# Patient Record
Sex: Male | Born: 1965 | Race: White | Hispanic: Yes | Marital: Married
Health system: Western US, Academic
[De-identification: ages and names within clinical notes are randomized; demographics above are authoritative.]

---

## 2020-09-24 HISTORY — PX: OTHER PROCEDURE: U1053

## 2020-10-05 ENCOUNTER — Ambulatory Visit: Admit: 2020-10-05 | Payer: BC Managed Care – PPO | Source: Other Acute Inpatient Hospital

## 2020-10-05 ENCOUNTER — Encounter (HOSPITAL_COMMUNITY): Payer: Self-pay

## 2020-10-07 ENCOUNTER — Telehealth (HOSPITAL_COMMUNITY): Payer: Self-pay

## 2020-10-07 ENCOUNTER — Encounter (INDEPENDENT_AMBULATORY_CARE_PROVIDER_SITE_OTHER): Payer: Self-pay | Admitting: Pulmonary Medicine

## 2020-10-07 ENCOUNTER — Telehealth (INDEPENDENT_AMBULATORY_CARE_PROVIDER_SITE_OTHER): Payer: Self-pay | Admitting: Pulmonary Medicine

## 2020-10-07 ENCOUNTER — Encounter (HOSPITAL_COMMUNITY): Payer: Self-pay

## 2020-10-07 DIAGNOSIS — K559 Vascular disorder of intestine, unspecified: Secondary | ICD-10-CM

## 2020-10-07 DIAGNOSIS — R5381 Other malaise: Secondary | ICD-10-CM

## 2020-10-07 DIAGNOSIS — J80 Acute respiratory distress syndrome: Secondary | ICD-10-CM

## 2020-10-07 DIAGNOSIS — U071 COVID-19: Secondary | ICD-10-CM

## 2020-10-07 DIAGNOSIS — Z7682 Awaiting organ transplant status: Secondary | ICD-10-CM

## 2020-10-07 NOTE — Progress Notes (Signed)
Lung and Heart-Lung Transplant Medical Chart Review    The medical chart for lung transplant referral has been reviewed by me today.     Other: Untreatable Disease, Substantial limitations of daily activity, Limited Life expectancy      This candidate is a 54 year old man without any past medical history contracted COVID-19 . His course worsened and progressively required intubation and placed on mechanical ventilation 09/15/2020 without any continuous paralytic use, but did require higher sedation needs.   09/24/2020, he underwent an emergent expl lap for ischmic bowel. He is on TPN    Currently off sedation x 3 days, but only grimaces.  CT head 10/03/2020 without any pathology    Remains on iv ABX. No pressors.    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    Physical deconditioning 10/07/2020       Past Surgical History:   Procedure Laterality Date    exploratroy lap  09/24/2020       Social History     Socioeconomic History    Marital status: Married     Spouse name: Not on file    Number of children: Not on file    Years of education: Not on file    Highest education level: Not on file   Occupational History    Not on file   Tobacco Use    Smoking status: Not on file    Smokeless tobacco: Not on file   Substance and Sexual Activity    Alcohol use: Not on file    Drug use: Not on file    Sexual activity: Not on file   Social Activities of Daily Living Present    Not on file   Social History Narrative    Not on file           After careful review of the available medical records, it has been decided that lung transplantation is not an option.     Declined. The decision was based on multiple co-morbid conditions. Reason: Current significant illness which is likely to  contribute to poor outcome.     Remains on TPN without bowel function  Bed bound without any evidence of improvement of functional status

## 2020-10-07 NOTE — Telephone Encounter (Signed)
Encounter for EOC and turn down letter.
